# Patient Record
Sex: Female | Born: 1965 | Race: White | Hispanic: No | Marital: Married | Smoking: Current every day smoker
Health system: Southern US, Community
[De-identification: ages and names within clinical notes are randomized; demographics above are authoritative.]

---

## 2015-08-03 ENCOUNTER — Other Ambulatory Visit: Payer: Self-pay | Admitting: Nurse Practitioner

## 2015-08-03 DIAGNOSIS — Z1231 Encounter for screening mammogram for malignant neoplasm of breast: Secondary | ICD-10-CM

## 2015-08-24 ENCOUNTER — Ambulatory Visit: Payer: Self-pay

## 2015-08-31 ENCOUNTER — Ambulatory Visit
Admission: RE | Admit: 2015-08-31 | Discharge: 2015-08-31 | Disposition: A | Discharge status: Home / Self Care | Payer: Managed Care, Other (non HMO) | Source: Ambulatory Visit | Attending: Nurse Practitioner | Admitting: Nurse Practitioner

## 2015-08-31 DIAGNOSIS — Z1231 Encounter for screening mammogram for malignant neoplasm of breast: Secondary | ICD-10-CM

## 2017-09-04 ENCOUNTER — Emergency Department (HOSPITAL_COMMUNITY)
Admission: EM | Admit: 2017-09-04 | Discharge: 2017-09-04 | Disposition: A | Discharge status: Home / Self Care | Payer: Managed Care, Other (non HMO) | Attending: Emergency Medicine | Admitting: Emergency Medicine

## 2017-09-04 ENCOUNTER — Encounter (HOSPITAL_COMMUNITY): Payer: Self-pay

## 2017-09-04 ENCOUNTER — Other Ambulatory Visit: Payer: Self-pay

## 2017-09-04 ENCOUNTER — Emergency Department (HOSPITAL_COMMUNITY): Payer: Managed Care, Other (non HMO)

## 2017-09-04 DIAGNOSIS — R079 Chest pain, unspecified: Secondary | ICD-10-CM | POA: Diagnosis present

## 2017-09-04 DIAGNOSIS — F17210 Nicotine dependence, cigarettes, uncomplicated: Secondary | ICD-10-CM | POA: Insufficient documentation

## 2017-09-04 DIAGNOSIS — Z79899 Other long term (current) drug therapy: Secondary | ICD-10-CM | POA: Insufficient documentation

## 2017-09-04 LAB — BASIC METABOLIC PANEL
Anion gap: 8 (ref 5–15)
BUN: 10 mg/dL (ref 6–20)
CALCIUM: 9.6 mg/dL (ref 8.9–10.3)
CO2: 28 mmol/L (ref 22–32)
CREATININE: 0.87 mg/dL (ref 0.44–1.00)
Chloride: 104 mmol/L (ref 98–111)
GFR calc non Af Amer: >60 mL/min (ref >60)
Glucose, Bld: 94 mg/dL (ref 70–99)
Potassium: 4 mmol/L (ref 3.5–5.1)
SODIUM: 140 mmol/L (ref 135–145)

## 2017-09-04 LAB — CBC
HCT: 45.2 % (ref 36.0–46.0)
Hemoglobin: 15 g/dL (ref 12.0–15.0)
MCH: 34.5 pg — AB (ref 26.0–34.0)
MCHC: 33.2 g/dL (ref 30.0–36.0)
MCV: 103.9 fL — ABNORMAL HIGH (ref 78.0–100.0)
PLATELETS: 406 10*3/uL — AB (ref 150–400)
RBC: 4.35 MIL/uL (ref 3.87–5.11)
RDW: 13.5 % (ref 11.5–15.5)
WBC: 9.8 10*3/uL (ref 4.0–10.5)

## 2017-09-04 LAB — I-STAT TROPONIN, ED: TROPONIN I, POC: 0 ng/mL (ref 0.00–0.08)

## 2017-09-04 NOTE — ED Provider Notes (Signed)
[facility]-EMERGENCY DEPT Provider Note   CSN: [id number] Arrival date & time: 09/04/17  1530     History   Chief Complaint Chief Complaint  Patient presents with  . Chest Pain    HPI Nicoletta Dressngela Jocson is a 52 y.o. female.  HPI Patient presents with chest pain.  Has had for the last 2 days.  Has been on anterior chest.  Has moved around somewhat on the anterior chest but is been there since yesterday.  Worse today.  Worse with movements.  Worse with certain breaths.  No difficulty breathing.  No clear trauma but states she did move furniture this last weekend with today being Friday.  No swelling in her legs.  No cardiac history.  She is adopted so does not know her family history.  She does not feel short of breath.  She does smoke cigarettes. History reviewed. No pertinent past medical history.  There are no active problems to display for this patient.   History reviewed. No pertinent surgical history.   OB History   None      Home Medications    Prior to Admission medications   Medication Sig Start Date End Date Taking? Authorizing Provider  ALPRAZolam (XANAX) 0.5 MG tablet TAKE 1/2 (0.25 MG) TO 1 TABLET (0.5 MG) BY MOUTH TWICE DAILY AS NEEDED FOR ACUTE ANXIETY 07/28/17  Yes [provider]  cloNIDine (CATAPRES) 0.1 MG tablet Take 0.1 mg by mouth daily.   Yes [provider]  FLUoxetine (PROZAC) 40 MG capsule Take 40 mg by mouth daily.  06/27/17  Yes [provider]  loratadine (CLARITIN) 10 MG tablet Take 10 mg by mouth daily as needed for allergies.   Yes [provider]    Family History Family History  Adopted: Yes    Social History Social History   Tobacco Use  . Smoking status: Current Every Day Smoker    Packs/day: 1.00    Types: Cigarettes  . Smokeless tobacco: Never Used  Substance Use Topics  . Alcohol use: Not Currently    Comment: no alcohol x 3 weeks.  . Drug use: Yes    Types: Marijuana      Comment: occasionally     Allergies   Patient has no known allergies.   Review of Systems Review of Systems  Constitutional: Negative for appetite change.  HENT: Negative for congestion.   Respiratory: Negative for shortness of breath.   Cardiovascular: Positive for chest pain. Negative for leg swelling.  Gastrointestinal: Negative for abdominal distention and abdominal pain.  Genitourinary: Negative for flank pain.  Musculoskeletal: Negative for back pain.  Skin: Negative for rash.  Neurological: Negative for weakness.  Hematological: Negative for adenopathy.  Psychiatric/Behavioral: Negative for confusion.     Physical Exam Updated Vital Signs BP 137/90   Pulse 63   Temp 98.2 F (36.8 C) (Oral)   Resp (!) 22   Ht 5\' 11"  (1.803 m)   Wt 86.2 kg (190 lb)   SpO2 97%   BMI 26.50 kg/m   Physical Exam  Constitutional: She appears well-developed.  HENT:  Head: Normocephalic.  Eyes: Pupils are equal, round, and reactive to light.  Neck: Neck supple.  Cardiovascular: Normal rate and normal pulses.  Pulmonary/Chest:  Tenderness to anterior chest wall on the bilateral parasternal area.  No rash.  Abdominal: Soft. There is no tenderness.  Musculoskeletal:       Right lower leg: She exhibits no edema.  Left lower leg: She exhibits no edema.  Neurological: She is alert.  Skin: Skin is warm. Capillary refill takes less than 2 seconds.  Psychiatric: She has a normal mood and affect.     ED Treatments / Results  Labs (all labs ordered are listed, but only abnormal results are displayed) Labs Reviewed  CBC - Abnormal; Notable for the following components:      Result Value   MCV 103.9 (*)    MCH 34.5 (*)    Platelets 406 (*)    All other components within normal limits  BASIC METABOLIC PANEL  I-STAT TROPONIN, ED    EKG EKG Interpretation  Date/Time:  Friday September 04 2017 15:35:53 EDT Ventricular Rate:  77 PR Interval:    QRS Duration: 96 QT  Interval:  391 QTC Calculation: 443 R Axis:   47 Text Interpretation:  Sinus rhythm Low voltage, precordial leads Confirmed by Benjiman Core [id number]) on 09/04/2017 4:06:05 PM   Radiology Dg Chest 2 View  Result Date: 09/04/2017 CLINICAL DATA:  Chest pain. EXAM: CHEST - 2 VIEW COMPARISON:  No prior. FINDINGS: Mediastinum hilar structures normal. Heart size normal. Lungs are clear of acute infiltrates. Solitary nodular opacities noted over the lower chest on AP view only consistent nipple shadows. No pleural effusion or pneumothorax. No acute bony abnormality. IMPRESSION: No acute abnormality. Electronically Signed   By: Maisie Fus  Register   On: 09/04/2017 17:01    Procedures Procedures (including critical care time)  Medications Ordered in ED Medications - No data to display   Initial Impression / Assessment and Plan / ED Course  I have reviewed the triage vital signs and the nursing notes.  Pertinent labs & imaging results that were available during my care of the patient were reviewed by me and considered in my medical decision making (see chart for details).     Patient with chest pain.  Anterior chest.  Worse with palpation.  EKG and work-up reassuring.  May be musculoskeletal.  Doubt pulmonary embolism.  Doubt cardiac cause.  X-ray reassuring.  Discharge home.  Final Clinical Impressions(s) / ED Diagnoses   Final diagnoses:  Nonspecific chest pain    ED Discharge Orders    None       Benjiman Core, MD 09/04/17 1819

## 2017-09-04 NOTE — ED Triage Notes (Signed)
Patient c/o mid chest pain that was worse when she took a deep breath yesterday. Today the pain is worse when she moves.Patient denies any SOB and diaphoresis.

## 2019-03-03 ENCOUNTER — Other Ambulatory Visit: Payer: Self-pay | Admitting: Internal Medicine

## 2019-03-03 DIAGNOSIS — R209 Unspecified disturbances of skin sensation: Secondary | ICD-10-CM

## 2020-02-08 IMAGING — CR DG CHEST 2V
2 series · 2 of 2 positions shown · non-contrast
Comparison: No prior.

CLINICAL DATA: Chest pain.

EXAM:
CHEST - 2 VIEW

[w chest pa]
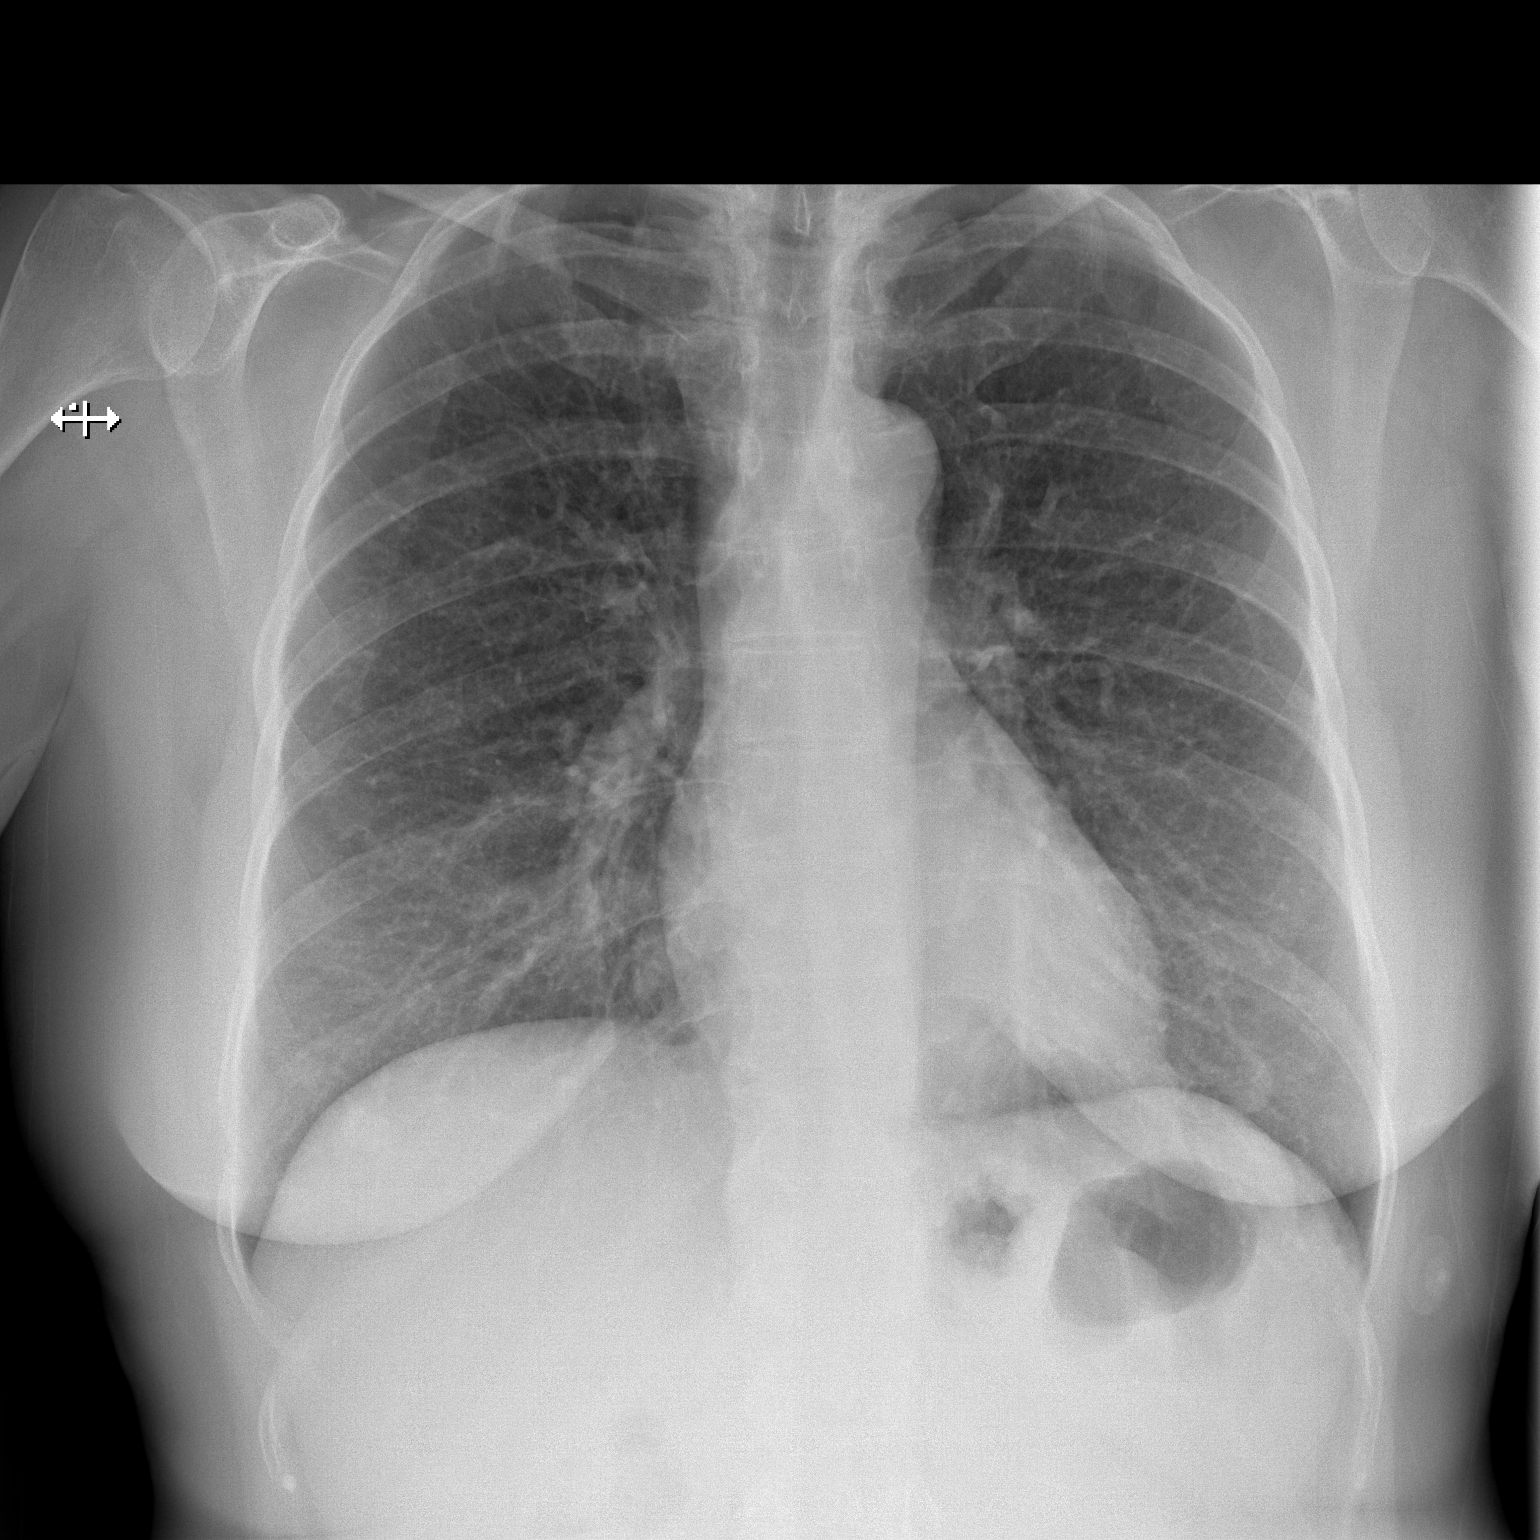

[w chest lat]
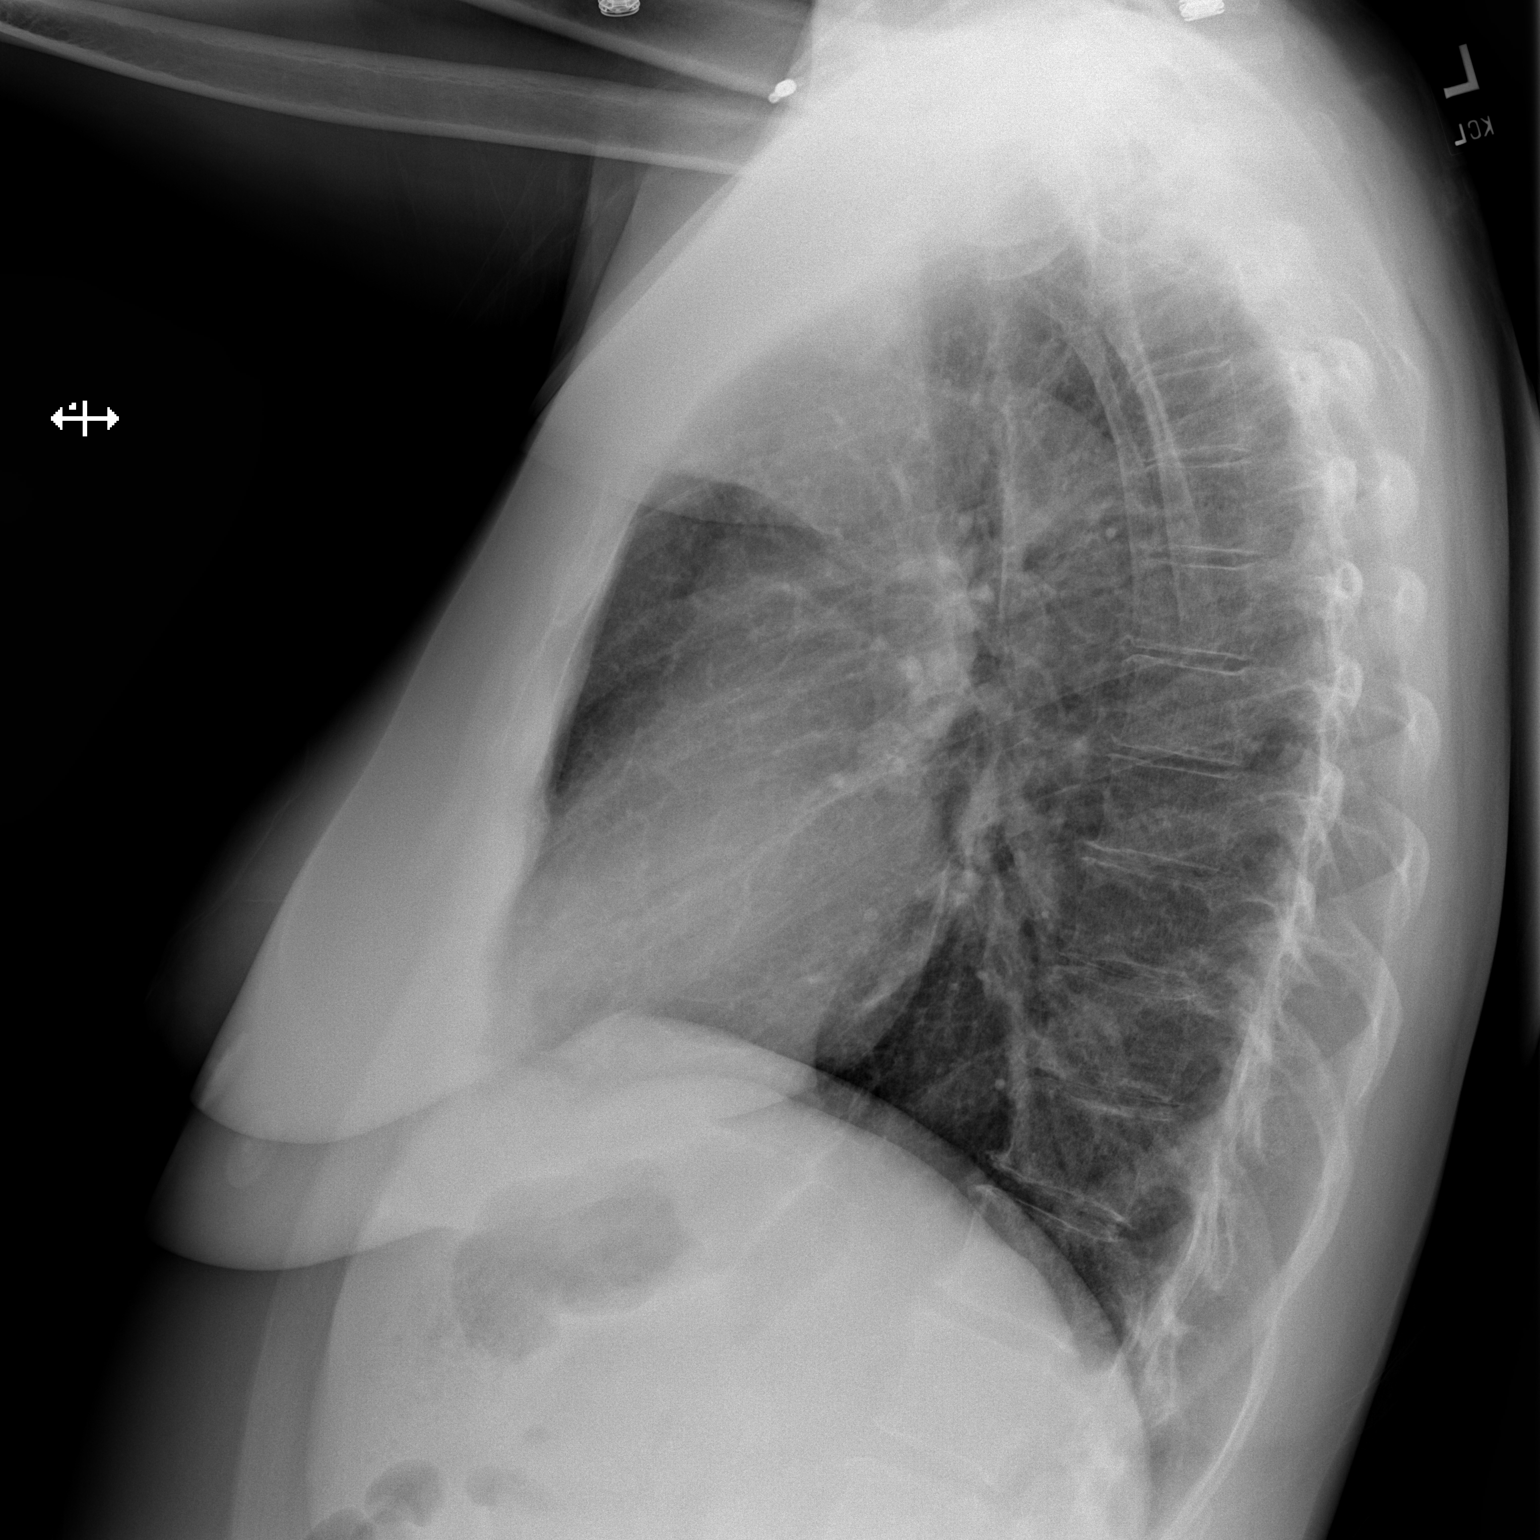

[2 of 2 positions shown; findings below may reference images not displayed]

FINDINGS: Mediastinum hilar structures normal. Heart size normal. Lungs are
clear of acute infiltrates. Solitary nodular opacities noted over
the lower chest on AP view only consistent nipple shadows. No
pleural effusion or pneumothorax. No acute bony abnormality.
IMPRESSION: No acute abnormality.

## 2020-10-25 ENCOUNTER — Ambulatory Visit (INDEPENDENT_AMBULATORY_CARE_PROVIDER_SITE_OTHER): Payer: 59 | Admitting: Psychiatry

## 2020-10-25 ENCOUNTER — Other Ambulatory Visit: Payer: Self-pay

## 2020-10-25 DIAGNOSIS — F411 Generalized anxiety disorder: Secondary | ICD-10-CM | POA: Diagnosis not present
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
## 2020-12-11 ENCOUNTER — Other Ambulatory Visit: Payer: Self-pay

## 2020-12-11 ENCOUNTER — Ambulatory Visit (INDEPENDENT_AMBULATORY_CARE_PROVIDER_SITE_OTHER): Payer: 59 | Admitting: Psychiatry

## 2020-12-11 DIAGNOSIS — F411 Generalized anxiety disorder: Secondary | ICD-10-CM

## 2020-12-11 NOTE — Progress Notes (Signed)
[facility] Counselor/Therapist Progress Note  Patient ID: Shari Whitehead, MRN: [id number],    Date: 12/11/2020  Time Spent: 58 minutes   Treatment Type: Individual Therapy  Reported Symptoms: anxiety, depression  Mental Status Exam:  Appearance:   Well Groomed     Behavior:  Appropriate, Sharing, and Motivated  Motor:  Normal  Speech/Language:   Clear and Coherent  Affect:  Depressed and anxious  Mood:  anxious and depressed  Thought process:  goal directed  Thought content:    Overthinking and worries about things that will never happen  Sensory/Perceptual disturbances:    WNL  Orientation:  oriented to person, place, time/date, situation, day of week, month of year, year, and stated date of Oct. 25, 2022  Attention:  Good  Concentration:  Good and Fair  Memory:  WNL  Fund of knowledge:   Good  Insight:    Good  Judgment:   Good  Impulse Control:  Good   Risk Assessment: Danger to Self:  No Self-injurious Behavior: No Danger to Others: No Duty to Warn:no Physical Aggression / Violence:No  Access to Firearms a concern: No  Gang Involvement:No   Subjective: Patient in today reporting anxiety and depression. Tendency to always look for worst case scenarios. Really worked hard on stopping her alcohol consumption for 2 wks and then drank some again. Talked through this today and states she is going to "stick with it and stop drinking again and commits to begin that tonight." Husband does not drink at all. Does not want to be involved with AA but states she can do this on her own with husband's support.  States she wants to share and talk through from past experiences beginning with her dad committing suicide by shooting himself. Lots of mental health concerns of adults throughout family history, with patient sharing several parts of her history which she describes as scary, instability, and uncertain.  Is wanting to make a trip out there later this year and talks through  today the situations that she needs to work through before making that happen, and most importantly she wants to be sober before doing that and has already made a commitment on that.  Reviewed her priorities during session today and first and foremost is getting sober and staying sober.   Interventions: Solution-Oriented/Positive Psychology and Insight-Oriented  Diagnosis:   ICD-10-CM   1. Generalized anxiety disorder  F41.1        Treatment Goal Plan:  Patient not signing treatment plan on computer screen due to Covid. Treatment Goals: Treatment goals remain on treatment plan as patient works with strategies to achieve her goals.  Progress on goals is assessed each session and documented in the "progress" section of treatment note. Long term goal: Reduce overall level, frequency, and intensity of the anxiety so that daily functioning is not impaired. Short term goal: Verbalize an understanding of the role that anxious/fearful/negative thinking plays in creating excessive worry and anxiety with persistent anxiety symptoms. Strategies: Identify, challenge, and replace anxious/depressive/negative self talk with positive, realistic, and empowering self talk.     Plan: Patient today showing good motivation, and wanting to move forward  because I want a  different life and to feel more healthy and whole as a person.  Worked on some family related issues as noted above and also on the importance of her getting and staying sober which is her priority going forward.  (Not all details included in this note due to patient privacy  needs).  Encouraged patient to use some of her supportive relationships as she is going through this time of stopping drinking, in addition to her husband support which she describes as 100%.  Patient is also encouraged to get outside some daily and walk, to practice some consistent positive self talk, to believe in herself, to not go to places or be with people that are going  be tempting for her to drink, to allow for good sleep pattern, to be truthful with herself, to look for more positives than negatives each day, to find the positives within herself, and feel good about the strength she shows working with goal-directed behaviors to move in a direction that supports improved emotional health, her sobriety, and overall wellbeing   Goal review and progress/challenges noted with patient.  Next appointment within 2 to 3 weeks.  This record has been created using [employer].  Chart creation errors have been sought, but may not always have been located and corrected.  Such creation errors do not reflect on the standard of medical care provided.    Mathis Fare, LCSW

## 2020-12-25 ENCOUNTER — Other Ambulatory Visit: Payer: Self-pay

## 2020-12-25 ENCOUNTER — Ambulatory Visit (INDEPENDENT_AMBULATORY_CARE_PROVIDER_SITE_OTHER): Payer: 59 | Admitting: Psychiatry

## 2020-12-25 DIAGNOSIS — F411 Generalized anxiety disorder: Secondary | ICD-10-CM
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
## 2021-01-08 ENCOUNTER — Ambulatory Visit (INDEPENDENT_AMBULATORY_CARE_PROVIDER_SITE_OTHER): Payer: 59 | Admitting: Psychiatry

## 2021-01-08 ENCOUNTER — Other Ambulatory Visit: Payer: Self-pay

## 2021-01-08 DIAGNOSIS — F411 Generalized anxiety disorder: Secondary | ICD-10-CM

## 2021-01-08 NOTE — Progress Notes (Signed)
[facility] Counselor/Therapist Progress Note  Patient ID: Shari Whitehead, MRN: [id number],    Date: 01/08/2021  Time Spent: 57 minutes   Treatment Type: Individual Therapy  Reported Symptoms: anxiety, sadness re: dog is dying  Mental Status Exam:  Appearance:   Well Groomed     Behavior:  Appropriate, Sharing, and Motivated  Motor:  Normal  Speech/Language:   Clear and Coherent  Affect:  Tearful and anxiety  Mood:  anxious and sad  Thought process:  goal directed  Thought content:    WNL  Sensory/Perceptual disturbances:    WNL  Orientation:  oriented to person, place, time/date, situation, day of week, month of year, year, and stated date of Nov. 22, 2022  Attention:  Fair  Concentration:  Fair  Memory:  WNL  Fund of knowledge:   Good  Insight:    Good  Judgment:   Good  Impulse Control:  Good   Risk Assessment: Danger to Self:  No Self-injurious Behavior: No Danger to Others: No Duty to Warn:no Physical Aggression / Violence:No  Access to Firearms a concern: No  Gang Involvement:No   Subjective:  Patient in today reporting anxiety and sadness (re: "my dog is dying") per her vet. Processed some of her sadness and tears which seemed helpful to her. Also further processed her dad's suicide at home while patient was there.  Had worked with this some over the years including therapy with  EMDR. Wanting/needing to develop "more of a life here" including finding activities she and her husband would be interested in. Wants to find a faith community and they plan to visit some churches. Thinks that may help some of her depression as well. Reflecting on family situation in [place] more today and realizing many changes and losses. Still staying alcohol-free for 2 weeks. Still working on not assuming the negative vs positive and not assuming worst case scenarios.  Processes how she feels the chaos with which she grew up in impacted her in so many ways and has led to some of her  negative ways of doing things and also her tendency to often stay off to herself and not engage in activities with others, but is starting to do that on a limited basis.  Interventions: Ego-Supportive and Insight-Oriented  Treatment Goal Plan:  Patient not signing treatment plan on computer screen due to Covid. Treatment Goals: Treatment goals remain on treatment plan as patient works with strategies to achieve her goals.  Progress on goals is assessed each session and documented in the "progress" section of treatment note. Long term goal: Reduce overall level, frequency, and intensity of the anxiety so that daily functioning is not impaired. Short term goal: Verbalize an understanding of the role that anxious/fearful/negative thinking plays in creating excessive worry and anxiety with persistent anxiety symptoms. Strategies: Identify, challenge, and replace anxious/depressive/negative self talk with positive, realistic, and empowering self talk.   Diagnosis:   ICD-10-CM   1. Generalized anxiety disorder  F41.1       Plan:  Patient today showing good motivation and engagement in session as she worked more on some family history that was traumatic and has led to some of her difficulties in adulthood including anxiety, depression, avoiding people and quick to see the negatives versus the positives.  Is very concerned about her pet dog of several years who is now becoming more frail and sick and the vet has said he will not live much longer.  Patiently appropriately grieving that  but also very grateful for the gift of having had the dog as he has been with her during some really tough times in her life.  Overall, she states that she is wanting to have a fuller life and have more hope.  Feeling better about herself and her stopping her drinking.  Encouraged her and her practice of positive behaviors including: Believing in herself more, continuing to be alcohol free, accentuate the positive aspects  of her marriage with husband, get outside daily and walk, practice consistent positive self talk, stay away from places that are going to be tempting for her to drink, allow for better sleep pattern, be truthful with herself, to look for more positives than negatives each day, find the positives within herself, stay connected with supportive people, stay in the present and focus on what she can change, and feel good about the strength she shows working with goal-directed behaviors to move in a direction that supports improved emotional health, overall wellbeing, and her sobriety.  Goal review and progress/challenges noted with patient.  Next appointment within 2 to 3 weeks.  This record has been created using [employer].  Chart creation errors have been sought, but may not always have been located and corrected.  Such creation errors do not reflect on the standard of medical care provided.    Mathis Fare, LCSW

## 2021-01-23 ENCOUNTER — Other Ambulatory Visit: Payer: Self-pay

## 2021-01-23 ENCOUNTER — Ambulatory Visit (INDEPENDENT_AMBULATORY_CARE_PROVIDER_SITE_OTHER): Payer: 59 | Admitting: Psychiatry

## 2021-01-23 ENCOUNTER — Ambulatory Visit: Payer: 59 | Admitting: Psychiatry

## 2021-01-23 DIAGNOSIS — F411 Generalized anxiety disorder: Secondary | ICD-10-CM

## 2021-01-23 NOTE — Progress Notes (Signed)
[facility] Counselor/Therapist Progress Note  Patient ID: Shari Whitehead, MRN: [id number],    Date: 01/23/2021  Time Spent: 50 minutes   Treatment Type: Individual Therapy  Reported Symptoms: anxiety, overthinking/obsessive thoughts some better  Mental Status Exam:  Appearance:   Well Groomed     Behavior:  Appropriate, Sharing, and Motivated  Motor:  Normal  Speech/Language:   Clear and Coherent  Affect:  anxious  Mood:  anxious  Thought process:  goal directed  Thought content:    Obsessions and overthinking  Sensory/Perceptual disturbances:    WNL  Orientation:  oriented to person, place, time/date, situation, day of week, month of year, year, and stated date of Dec. 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  Also reports anxiety and some depression, some of which she feels is holiday/family-related.  Reports following through on her homework from last session re: changes she wants to make here locally versus as consumed with trying to change things in [place] where others in her family lives. Realized she was neglecting what was contributing to her distress here locally, versus focusing on possible eventual return to [place]. Shares acute sadness about her dog who is "in process of dying". Stressed at work and at home, "never enough time to get everything done." Wondering about some health concerns and is to be in touch with her PCP about annual  physical.  Processed more of some sad issues from her past that complicate the holidays more for her including her dad's suicide at home while she was there with him.  Is following up on "developing more of a normal life here in [place]" and states she is still wanting for her and her husband to find a church that feels like a good fit for them, and help her feel more grounded spiritually.  Does find herself reflecting on family more in [place] after so many changes and losses within the family there.  Feels that she is some better and not assuming the negatives and trying to look for more positives and situations.   Interventions: Solution-Oriented/Positive Psychology and Insight-Oriented  Treatment Goal Plan:  Patient not signing treatment plan on computer screen due to Covid. Treatment Goals: Treatment goals remain on treatment plan as patient works with strategies to achieve her goals.  Progress on goals is assessed each session and documented in the "progress" section of treatment note. Long term goal: Reduce overall level, frequency, and intensity of the anxiety so that daily functioning is not impaired. Short term goal: Verbalize an understanding of the role that anxious/fearful/negative thinking plays in creating excessive worry and anxiety with persistent anxiety symptoms. Strategies: Identify, challenge, and replace anxious/depressive/negative self talk with positive, realistic, and empowering self talk.  Diagnosis:   ICD-10-CM   1. Generalized anxiety disorder  F41.1      Plan:  Patient today showing good motivation and active participation in session as she worked  more on some issues related to her family history that have been traumatic, focusing more on settling down now where she is here in [facility] versus any future plans about [place] and moving back as that seems real uncertain at this point.  Working to stop jumping to the negatives versus seeing the positives,  better self-care, and being more patient with herself as she works on goal-directed behaviors as noted above.  Grieving regarding her very sick dog that she has had for a number of years.  Desires to have a fuller life and feel more hopeful and is trying to change some of her behaviors that could contribute to that.  Does feel some increased self confidence after having stopped her drinking.  Encouraged patient and her practice of positive behaviors including: Believing in her ability to make necessary changes to have a fuller life and be more hopeful, continuing to be alcohol free, accentuate the positive aspects of her marriage with husband, get outside daily and walk, practice consistent positive self talk, stay away from places that are going to be tempting for her to drink, allow for better sleep pattern, be truthful with herself, look for more positives and negatives each day, find the positives within herself, stay connected with supportive people, stay in the present and focus on what she can change, and realize the strength she shows working with goal-directed behaviors to move in a direction that supports improved emotional health, her sobriety, and overall wellbeing.  Goal review and progress/challenges noted with patient.  Appointment within 2 to 3 weeks.  This record has been created using [employer].  Chart creation errors have been sought, but may not always have been located and corrected.  Such creation errors do not reflect on the standard of medical care provided.   Mathis Fare, LCSW

## 2021-02-19 ENCOUNTER — Ambulatory Visit: Payer: 59 | Admitting: Psychiatry

## 2021-03-05 ENCOUNTER — Other Ambulatory Visit: Payer: Self-pay

## 2021-03-05 ENCOUNTER — Ambulatory Visit: Payer: 59 | Admitting: Psychiatry

## 2021-03-05 DIAGNOSIS — F411 Generalized anxiety disorder: Secondary | ICD-10-CM

## 2021-03-05 NOTE — Progress Notes (Signed)
[facility] Counselor/Therapist Progress Note  Patient ID: Shari Whitehead, MRN: [id number],    Date: 03/05/2021  Time Spent: 55 minutes   Treatment Type: Individual Therapy  Reported Symptoms: anxiety, grief over loss of longtime pet dog  Mental Status Exam:  Appearance:   Neat     Behavior:  Appropriate, Sharing, and Motivated  Motor:  Normal  Speech/Language:   Clear and Coherent  Affect:  Anxious, some grief  Mood:  anxious and sad  Thought process:  goal directed  Thought content:    WNL  Sensory/Perceptual disturbances:    WNL  Orientation:  oriented to person, place, time/date, situation, day of week, month of year, year, and stated date of Jan. 17, 2023  Attention:  Good  Concentration:  Good and Fair  Memory:  WNL  Fund of knowledge:   Good  Insight:    Good and Fair  Judgment:   Good  Impulse Control:  Good   Risk Assessment: Danger to Self:  No Self-injurious Behavior: No Danger to Others: No Duty to Warn:no Physical Aggression / Violence:No  Access to Firearms a concern: No  Gang Involvement:No   Subjective: Patient in today reporting that her pet dog "Ree Kida" died last week which was really tough for patient and she worked through more of her grief today and feels she is making progress. Had begun to drink again more recently and quit on Jan. 1, 2023 and hasn't consumed alcohol since that time.  Husband very supportive of her. Obsessive thoughts and overthinking is improving some. Relationship with mom improving some and feels no anger towards either parent."I'm choosing to let go of old feelings that hold me back from moving forward and focused on this more in session today, and ways to keep her progress going.  "Not as consumed with trying to change others especially her family that live in [place]." Not as stressed at work nor home. Following up on some health concerns mentioned last session and has made appt with her PCP. Working to have " a more stable  life" and "am making some good changes and determined to stay alcohol-free." Doing well in setting limits with family in [place] but not trying to change them. Not assuming so many negatives and intentionally looking for more positive daily and within herself and others.   Interventions: Solution-Oriented/Positive Psychology, Ego-Supportive, and Insight-Oriented   Treatment Goal Plan:  Patient not signing treatment plan on computer screen due to Covid. Treatment Goals: Treatment goals remain on treatment plan as patient works with strategies to achieve her goals.  Progress on goals is assessed each session and documented in the "progress" section of treatment note. Long term goal: Reduce overall level, frequency, and intensity of the anxiety so that daily functioning is not impaired. Short term goal: Verbalize an understanding of the role that anxious/fearful/negative thinking plays in creating excessive worry and anxiety with persistent anxiety symptoms. Strategies: Identify, challenge, and replace anxious/depressive/negative self talk with positive, realistic, and empowering self talk.  Diagnosis:   ICD-10-CM   1. Generalized anxiety disorder  F41.1      Plan: Patient today showing good motivation and was well engaged in session. Has done well in following through on her goal-directed behaviors and in stopping her use of alcohol. Husband very supportive. Letting go of family issues that she had be"knows some of her changes are very new and is prepared to interrupt negative thought patterns if they arise and not let anything get in my  way of getting and staying better." Not jumping to negative conclusions so much. Encouraged her in her practice of positive behaviors including: Reflecting on her positives daily, believing in her ability to make necessary changes to have a fuller life and be more hopeful, continuing to be alcohol free, accentuate the positive aspects of her marriage with husband,  get outside daily and walk, practice consistent positive self-talk, stay away from places that are going to be tempting for her to drink, allow for better sleep patterns, be truthful with herself, look for more positives versus negatives daily, find the positives within herself, staying connected with supportive people, staying in the present and focusing on what she can change, and feel good about the strength she shows working with goal-directed behaviors to move in a direction that supports improved emotional health, her sobriety, and overall wellbeing.   Goal review and progress/challenges noted with patient.  Next appointment within 2 to 3 weeks.  This record has been created using [employer].  Chart creation errors have been sought, but may not always have been located and corrected.  Such creation errors do not reflect on the standard of medical care provided.  Mathis Fare, LCSW

## 2021-03-19 ENCOUNTER — Ambulatory Visit: Payer: 59 | Admitting: Psychiatry

## 2021-04-03 ENCOUNTER — Other Ambulatory Visit: Payer: Self-pay

## 2021-04-03 ENCOUNTER — Ambulatory Visit: Payer: 59 | Admitting: Psychiatry

## 2021-04-03 DIAGNOSIS — F411 Generalized anxiety disorder: Secondary | ICD-10-CM | POA: Diagnosis not present
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
## 2021-04-25 ENCOUNTER — Other Ambulatory Visit: Payer: Self-pay

## 2021-04-25 ENCOUNTER — Ambulatory Visit: Payer: 59 | Admitting: Psychiatry

## 2021-04-25 DIAGNOSIS — F411 Generalized anxiety disorder: Secondary | ICD-10-CM
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
## 2021-05-20 ENCOUNTER — Ambulatory Visit: Payer: 59 | Admitting: Psychiatry

## 2021-05-20 DIAGNOSIS — F411 Generalized anxiety disorder: Secondary | ICD-10-CM
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
## 2021-06-05 ENCOUNTER — Ambulatory Visit: Payer: 59 | Admitting: Psychiatry

## 2021-06-05 DIAGNOSIS — F411 Generalized anxiety disorder: Secondary | ICD-10-CM

## 2021-06-05 NOTE — Progress Notes (Signed)
?    [facility] Counselor/Therapist Progress Note ? ?Patient ID: Shari Whitehead, MRN: [id number],   ? ?Date: 06/05/2021 ? ?Time Spent: 55 minutes  ? ?Treatment Type: Individual Therapy ? ?Reported Symptoms: anxiety ? ?Mental Status Exam: ? ?Appearance:   Well Groomed     ?Behavior:  Appropriate, Sharing, and Motivated  ?Motor:  Normal  ?Speech/Language:   Clear and Coherent  ?Affect:  Anxious   ?Mood:  anxious  ?Thought process:  goal directed  ?Thought content:    overthinking  ?Sensory/Perceptual disturbances:    WNL  ?Orientation:  oriented to person, place, time/date, situation, day of week, month of year, year, and stated date of June 05, 2021  ?Attention:  Good  ?Concentration:  Good  ?Memory:  WNL  ?Fund of knowledge:   Good  ?Insight:    Good  ?Judgment:   Good  ?Impulse Control:  Good  ? ?Risk Assessment: ?Danger to Self:  No ?Self-injurious Behavior: No ?Danger to Others: No ?Duty to Warn:no ?Physical Aggression / Violence:No  ?Access to Firearms a concern: No  ?Gang Involvement:No  ? ?Subjective: Patient today reporting anxiety as main symptom and she continues to work with her anxiety using strategies including deep breathing, positive self-talk, and "encouraging myself a lot more." Trying to continue activities that are therapeutic for her including gardening, adult coloring books, reaching out to my neighbors. Fearful thoughts she was having, have decreased.Strong faith is "helping me too."Wanting more hope and belief in herself. Discussed her sense of hope and how she'd like to feel, as well as steps that she can work on to be more hopeful. He new dog is helping patient emotionally as well. Becoming more self-caring and assertive in ways for herself which she feels is encouraging. Trying to decrease sugar intake, and wanting to eat healthier.  Review of treatment goals with patient and she remains motivated as she is sensing her progress more.  Feels she is also moving beyond some of the feelings  she had in reference to the traumatic incident that occurred at work a few weeks ago, "not totally past it but am not dwelling on it as much".  Still hoping to locate a church that she can be connected to and that feels like a good fit for her and her husband, but not much progress on that yet.  Her confidence is increasing and it is obvious she is feeling that she is more capable of certain changes and especially of becoming more hopeful overall and better able to manage daily stressors.  Continue to encourage her with her improved self-care, increased self-confidence, and the use of healthy boundaries as needed with others. ? ?Interventions: Solution-Oriented/Positive Psychology and Insight-Oriented ? ?Treatment Goal Plan:  ?Patient not signing treatment plan on computer screen due to Covid. ?Treatment Goals: ?Treatment goals remain on treatment plan as patient works with strategies to achieve her goals.  Progress on goals is assessed each session and documented in the "progress" section of treatment note. ?Long term goal: ?Reduce overall level, frequency, and intensity of the anxiety so that daily functioning is not impaired. ?Short term goal: ?Verbalize an understanding of the role that anxious/fearful/negative thinking plays in creating excessive worry and anxiety with persistent anxiety symptoms. ?Strategies: ?Identify, challenge, and replace anxious/depressive/negative self talk with positive, realistic, and empowering self talk. ?  ?Diagnosis: ?  ICD-10-CM   ?1. Generalized anxiety disorder  F41.1   ?  ? ?Plan: Patient today showing active participation and good motivation in session as  she worked further on her anxiety, having a stronger sense of hopefulness, moving further past the traumatic event that occurred at work recently, and also being able to note progress in her self-esteem and her sense of becoming more hopeful.  Is doing some positives as far as staying in touch with friends, her supportive  husband, and reaching out to family in [place] but with healthier boundaries at this point.  She is remaining alcohol free and very committed to that change.  Express today how she really appreciates having a private place to come and talk through things that bother her most and know that it is a confidential environment.  Encouraged patient to be practicing more positive behaviors including: Believing in her ability to make necessary changes to have a fuller and healthier life, reflecting on her positives daily, continue to be alcohol free, accentuate the positive aspects of her marriage, get outside daily and walk, practice consistent positive self talk, stay away from places and people that are going to be tempting for her to drink, allow for better sleep patterns, be truthful with herself, find the positives within herself and be able to name them, intentionally look for the positives versus negatives daily, stay connected with supportive people, stay in the present focusing on what she can change or control versus cannot, and realize the strength she shows working with goal-directed behaviors to move in a direction that supports her improved emotional health, overall wellbeing, and continued sobriety. ? ?Goal review and progress/challenges noted with patient. ? ?Next appointment within 2 to 3 weeks. ? ?This record has been created using [employer].  Chart creation errors have been sought, but may not always have been located and corrected.  Such creation errors do not reflect on the standard of medical care provided. ? ? ?Mathis Fare, LCSW ? ? ? ? ? ? ? ? ? ? ? ? ? ? ? ? ? ? ?

## 2021-06-17 ENCOUNTER — Ambulatory Visit: Payer: 59 | Admitting: Psychiatry

## 2021-07-01 ENCOUNTER — Ambulatory Visit: Payer: 59 | Admitting: Psychiatry

## 2021-07-30 ENCOUNTER — Ambulatory Visit: Payer: 59 | Admitting: Psychiatry

## 2021-07-30 DIAGNOSIS — F411 Generalized anxiety disorder: Secondary | ICD-10-CM

## 2021-07-30 NOTE — Progress Notes (Signed)
[facility] Counselor/Therapist Progress Note  Patient ID: Shari Whitehead, MRN: [id number],    Date: 07/30/2021  Time Spent: 55 minutes   Treatment Type: Individual Therapy  Reported Symptoms: anxiety, depression; symptoms aggravated by sudden death in family and psych hospitalization of another family  Mental Status Exam:  Appearance:   Well Groomed     Behavior:  Appropriate, Sharing, and Motivated  Motor:  Normal  Speech/Language:   Clear and Coherent  Affect:  Depressed and anxious  Mood:  anxious and depressed  Thought process:  goal directed  Thought content:    Some overthinking  Sensory/Perceptual disturbances:    WNL  Orientation:  oriented to person, place, time/date, situation, day of week, month of year, year, and stated date of July 30, 2021  Attention:  Good  Concentration:  Good  Memory:  WNL  Fund of knowledge:   Good  Insight:    Good  Judgment:   Good  Impulse Control:  Good   Risk Assessment: Danger to Self:  No Self-injurious Behavior: No Danger to Others: No Duty to Warn:no Physical Aggression / Violence:No  Access to Firearms a concern: No  Gang Involvement:No   Subjective: Patient in today reporting anxiety, sadness, depression related to personal, work, and family concerns. Processed stressful situations at work including setting healthier boundaries, taking some summer vacation, making careful choices in work situations, all of which she discussed in session and seemed to have more clarity and able to identify certain need areas and ways of establishing healthy boundaries and see that as a good thing for her. Also processing very unexpected death of 54 yr old individual within her family out of state. Patient very sad especially due to deceased person's 3 teenage children and some extenuating circumstances.  Patient in contact with family and being supportive.  Also focusing on taking care of her own needs and emotions through positive self  talk, deep breathing, encouraging herself, and being in touch with supportive people, relying on her strong faith, and support from her husband.  Is working more since last session on believing in herself more and making decisions more based on what is healthy for her and not just everyone else especially on the work site.  Confidence gradually increasing.  Healthy boundaries in several areas of her life, stressed.  Interventions: Cognitive Behavioral Therapy and Solution-Oriented/Positive Psychology  Treatment Goals: Treatment goals remain on treatment plan as patient works with strategies to achieve her goals.  Progress on goals is assessed each session and documented in the "progress" section of treatment note. Long term goal: Reduce overall level, frequency, and intensity of the anxiety so that daily functioning is not impaired. Short term goal: Verbalize an understanding of the role that anxious/fearful/negative thinking plays in creating excessive worry and anxiety with persistent anxiety symptoms. Strategies: Identify, challenge, and replace anxious/depressive/negative self talk with positive, realistic, and empowering self talk  Diagnosis:   ICD-10-CM   1. Generalized anxiety disorder  F41.1      Plan:  Patient today showing good motivation and participation in session focusing on some sadness, depression, and anxiety related to her work, personal concerns, and family concerns.  As noted above she worked well in session on each of these and more heavily on her family situations with the unexpected death and also some significant issues at work where she is needing to set very clear limits.  Discussed some examples and setting limits that might be helpful for her today which  she seemed to gain strength from and plans to follow up on.  Does seem to continue to move forward beyond the traumatic event that occurred several weeks back at her work site.  Staying in better contact with friends.   Husband is supportive.  Continues reaching out to family in [place] and using stronger boundaries as needed. Encouraged patient in her practice of more positive behaviors including: Continue to be alcohol free, believing her ability to make significant changes to have a fuller and healthier life, reflect on her positives often, accentuate the positive aspects of her marriage, get outside daily and walk, practice consistent positive self talk, stay away from places and people that are going to be tempting for her to drink, allow for better sleep patterns, be truthful with herself, find the positives within herself and be able to name them, intentionally look for more positives daily, stay connected with supportive people, stay in the present focusing what she can change or control versus cannot, and recognize the strength she shows when working with goal directed behaviors to move in a direction that supports her improved emotional health, continued sobriety, and overall outlook.  Goal review and progress/challenges noted with patient.  Next appointment within 3 weeks.  This record has been created using [employer].  Chart creation errors have been sought, but may not always have been located and corrected.  Such creation errors do not reflect on the standard of medical care provided.   Shanon Ace, LCSW

## 2021-08-12 ENCOUNTER — Ambulatory Visit (INDEPENDENT_AMBULATORY_CARE_PROVIDER_SITE_OTHER): Payer: 59 | Admitting: Psychiatry

## 2021-08-12 DIAGNOSIS — F411 Generalized anxiety disorder: Secondary | ICD-10-CM | POA: Diagnosis not present

## 2021-09-03 ENCOUNTER — Ambulatory Visit: Payer: 59 | Admitting: Psychiatry

## 2021-09-17 ENCOUNTER — Ambulatory Visit: Payer: 59 | Admitting: Psychiatry

## 2022-09-03 ENCOUNTER — Ambulatory Visit (INDEPENDENT_AMBULATORY_CARE_PROVIDER_SITE_OTHER): Payer: Commercial Managed Care - PPO | Admitting: Podiatry

## 2022-09-03 ENCOUNTER — Encounter: Payer: Self-pay | Admitting: Podiatry

## 2022-09-03 ENCOUNTER — Ambulatory Visit (INDEPENDENT_AMBULATORY_CARE_PROVIDER_SITE_OTHER): Payer: Commercial Managed Care - PPO

## 2022-09-03 DIAGNOSIS — M205X1 Other deformities of toe(s) (acquired), right foot: Secondary | ICD-10-CM

## 2022-09-03 DIAGNOSIS — M2011 Hallux valgus (acquired), right foot: Secondary | ICD-10-CM

## 2022-09-03 DIAGNOSIS — D2372 Other benign neoplasm of skin of left lower limb, including hip: Secondary | ICD-10-CM

## 2022-09-03 NOTE — Progress Notes (Signed)
Chief Complaint  Patient presents with   Foot Pain    "I have a problem with this bone (bunion), this little toe, this place on my heel.  I have a place on the bottom of my left foot." N - bunion pain and toe pain L - 1st right and 5th right D - 3 mos for the bunion, 10+ years O - gradually C - numbness, sharp pain A - flexing T - none  N - place on my heel L - right D - 30 years O - suddenly  C - cracks A - certain shoes T - none    Subjective: 57 y.o. female presenting to the office today as a new patient for evaluation of multiple complaints to the bilateral feet.  First the patient states that she has developed symptomatic lesions to the left plantar forefoot as well as the right fifth toe.  They are very symptomatic and painful especially in close toed shoes.  She has tried some OTC salicylic acid with no improvement  Patient also states that she has developed some pain and tenderness associated to the right great toe joint.  Irritated by certain shoes.  She has not done anything for treatment  Finally the patient states that she developed soft tissue masses to the posterior aspect of the heels bilaterally.  They are not symptomatic but would like to have them evaluated   No past medical history on file.  No past surgical history on file.  No Known Allergies   Objective:  Physical Exam General: Alert and oriented x3 in no acute distress  Dermatology: Hyperkeratotic lesion(s) present on the left plantar forefoot as well as the right fifth digit. Pain on palpation with a central nucleated core noted. Skin is warm, dry and supple bilateral lower extremities. Negative for open lesions or macerations.  Vascular: Palpable pedal pulses bilaterally. No edema or erythema noted. Capillary refill within normal limits.  Neurological: Grossly intact via light touch  Musculoskeletal Exam: Pain on palpation at the keratotic lesion(s) noted. Range of motion within normal limits  bilateral. Muscle strength 5/5 in all groups bilateral.  There is also prominent spurring around the first MTP of the right foot with mild tenderness with palpation.  Piezogenic papules noted to the posterior heels bilateral.  Asymptomatic.  Radiographic exam RT foot 09/03/2022: Normal osseous mineralization.  Degenerative changes with periarticular spurring noted to the first MTP of the right foot consistent with hallux limitus.  Impression: Hallux limitus/DJD right first MTP  Assessment: 1.  Symptomatic benign skin lesion -Excisional debridement of the hyperkeratotic lesions was performed today using a 312 scalpel without incident or bleeding.  Patient felt relief.  Salicylic acid applied. -Recommend OTC salicylic acid daily x 4 weeks as tolerated under occlusion with a Band-Aid  2.  Hallux limitus first MTP right -X-rays reviewed -Discussed the pathology and etiology of hallux limitus and arthritis to the great great toe joint. -Offered anti-inflammatory cortisone injection however the patient ultimately declined.  She says it is only minimally symptomatic in certain shoes -Recommend conservative treatment.  Wide fitting shoes that do not irritate the great toe joint  3.  Piezogenic papules bilateral heels -Completely asymptomatic.  Explained the pathology and etiology of piezogenic papules.  Simply observe for now  -Return to clinic 4 weeks for follow-up evaluation of the symptomatic skin lesions   Felecia Shelling, DPM [facility]  Dr. Felecia Shelling, DPM    [address]  [place], [place] [address]                Office [phone]  Fax [phone]

## 2022-09-03 NOTE — Patient Instructions (Signed)
Corn callus remover (salicylic acid) available at any pharmacy  Aslo Vionic Women's Dress Shoes, or any shoe that is wide fitting and does not constrict the toebox

## 2022-10-06 ENCOUNTER — Ambulatory Visit: Payer: Commercial Managed Care - PPO | Admitting: Podiatry

## 2024-03-21 ENCOUNTER — Encounter: Payer: Self-pay | Admitting: Gastroenterology
